# Patient Record
Sex: Female | Born: 1935 | Race: White | Hispanic: No
Health system: Southern US, Community
[De-identification: ages and names within clinical notes are randomized; demographics above are authoritative.]

---

## 2009-12-02 ENCOUNTER — Ambulatory Visit: Payer: Self-pay | Admitting: Internal Medicine

## 2010-08-13 ENCOUNTER — Emergency Department: Payer: Self-pay | Admitting: Internal Medicine

## 2010-08-22 ENCOUNTER — Ambulatory Visit: Payer: Self-pay | Admitting: Internal Medicine

## 2010-08-31 ENCOUNTER — Ambulatory Visit: Payer: Self-pay | Admitting: Internal Medicine

## 2011-01-11 DEATH — deceased

## 2011-05-24 ENCOUNTER — Observation Stay: Payer: Self-pay | Admitting: Internal Medicine

## 2011-08-22 IMAGING — CT CT ANGIOGRAPHY NECK
1 of 4 series · 12 of 33 positions shown · IV contrast (APPLIED)
Comparison: none

REASON FOR EXAM: TIA
COMMENTS:

PROCEDURE:     CT  - CT ANGIOGRAPHY NECK W/CONTRAST  - August 31, 2010 [DATE]
RESULT:     Comparison: MRI of the brain 08/22/2010
TECHNIQUE: 100 ml of Isovue 370 was administered and the extracranial
carotid arteries bilaterally were scanned during the arterial phase from the
level of the superior portion of the frontal sinuses to the proximal neck.
These images were then transferred to the Siemens work station and were
subsequently reviewed utilizing 3-D reconstructions and MIP images.

[Series 6: soft tissue · axial · 0.44mm/px · z∈[+708,+924]mm · 12 of 86 slices shown]
[im 7/86  soft-tissue]
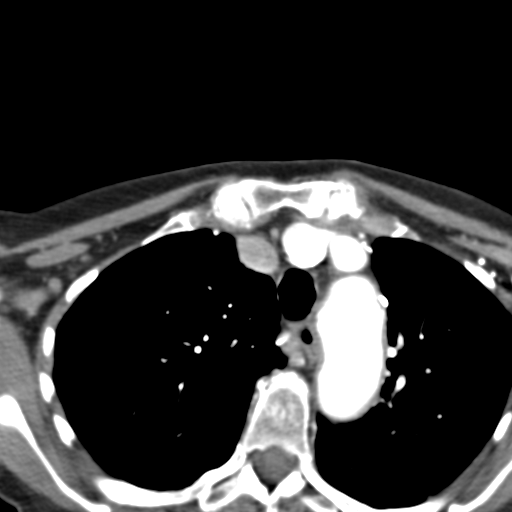
[im 14/86  bone]
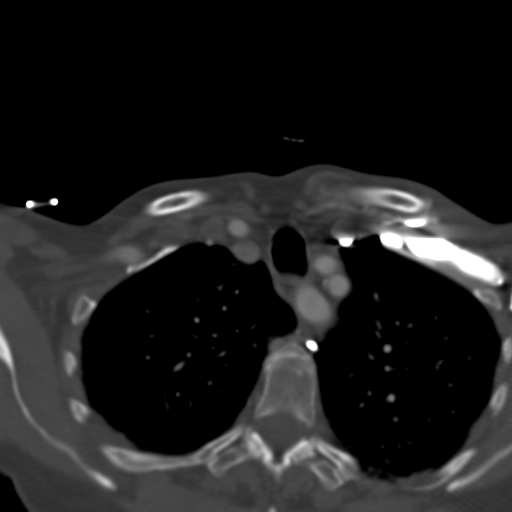
[im 20/86  soft-tissue]
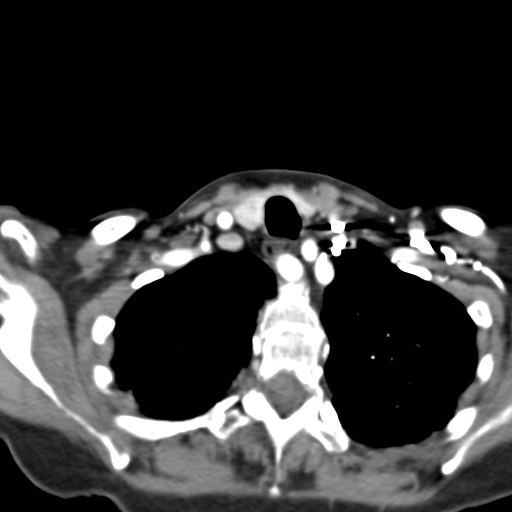
[im 27/86  bone]
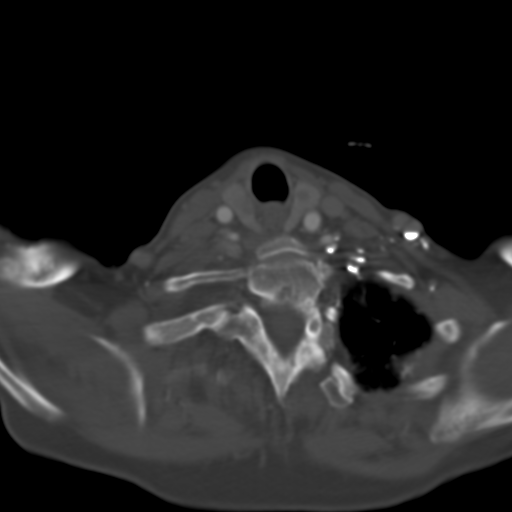
[im 33/86  soft-tissue]
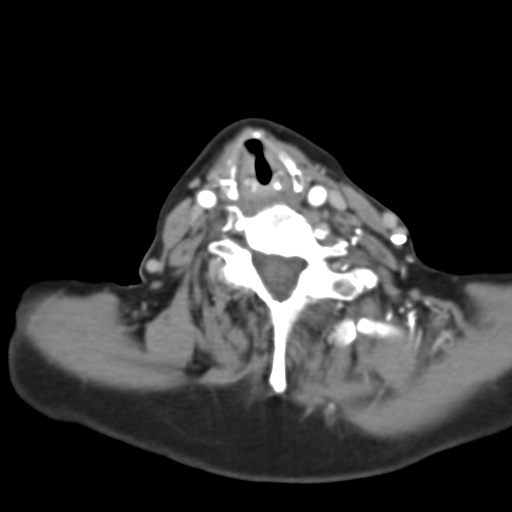
[im 40/86  bone]
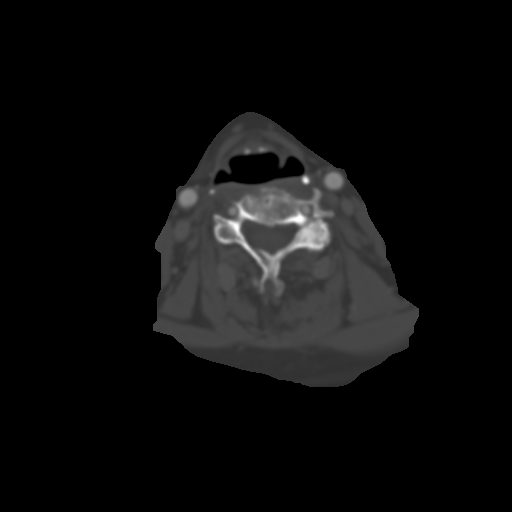
[im 46/86  soft-tissue]
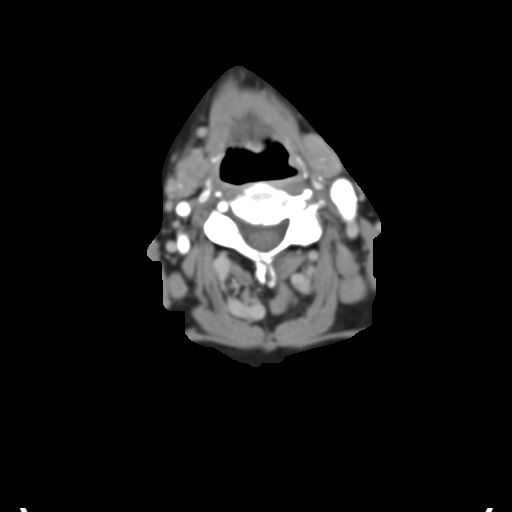
[im 53/86  bone]
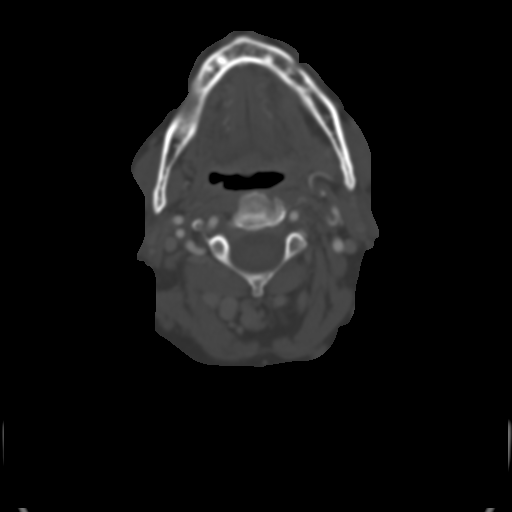
[im 59/86  soft-tissue]
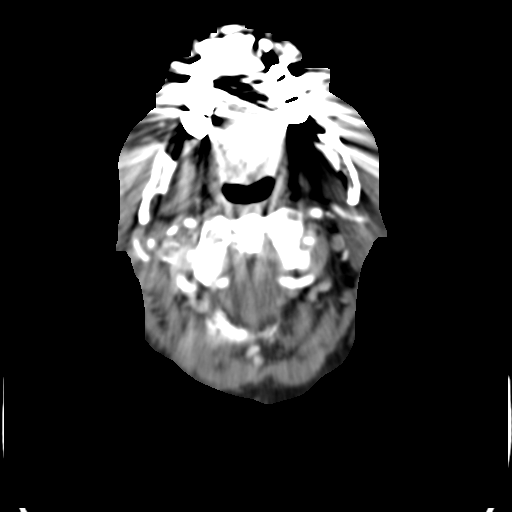
[im 66/86  bone]
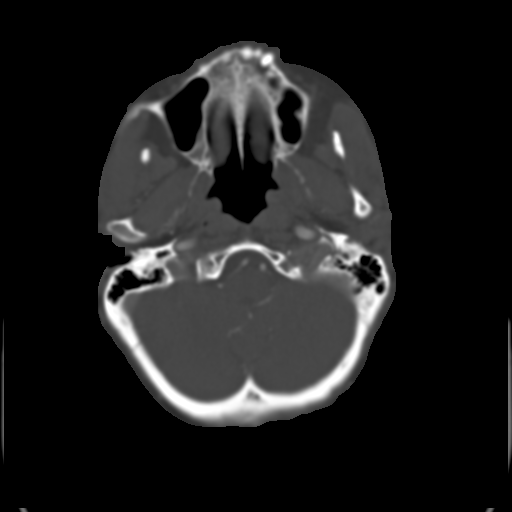
[im 72/86  soft-tissue]
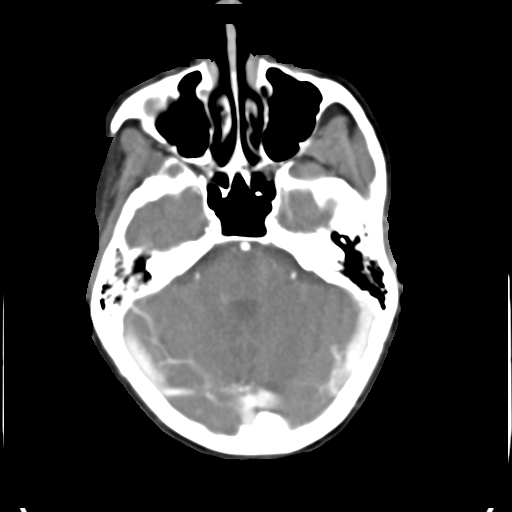
[im 79/86  bone]
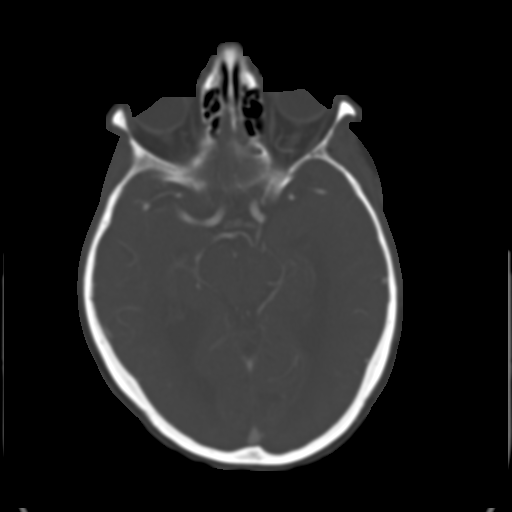

[12 of 33 positions shown; findings below may reference images not displayed]

FINDINGS: Mild atherosclerotic plaque is demonstrated in the right carotid bulb and
internal carotid artery, without hemodynamically significant stenosis. There
is minimal tortuosity of the right internal carotid artery. No
hemodynamically significant stenosis demonstrated in the left carotid
artery. The vertebral arteries are patent bilaterally.

There are multiple low-attenuation lesions in the bilateral lobes of the
thyroid, which are nonspecific. The largest measures 1.0 cm in diameter.
There are multiple collateral vessels in the posterior neck. No
lymphadenopathy identified. Mild vascular calcifications are demonstrated in
the thoracic aorta.

No aggressive lytic or sclerotic osseous lesions identified. There is mild
polypoid mucous thickening in the right maxillary sinus.
IMPRESSION: 1. Mild atherosclerotic plaques in the right carotid artery, without
hemodynamically significant stenosis.
2. Multiple small low-attenuation lesions in the thyroid which are
nonspecific. Further evaluation could be provided with thyroid ultrasound,
as indicated.

## 2015-02-11 ENCOUNTER — Ambulatory Visit
Admit: 2015-02-11 | Disposition: A | Discharge status: Home / Self Care | Payer: Self-pay | Attending: Hospice and Palliative Medicine | Admitting: Hospice and Palliative Medicine

## 2015-03-06 LAB — BASIC METABOLIC PANEL
Anion Gap: 10 (ref 7–16)
BUN: 31 mg/dL — ABNORMAL HIGH
CALCIUM: 9.3 mg/dL
CO2: 29 mmol/L
Chloride: 100 mmol/L — ABNORMAL LOW
Creatinine: 1.33 mg/dL — ABNORMAL HIGH
EGFR (African American): 40 — ABNORMAL LOW
GFR CALC NON AF AMER: 34 — AB
Glucose: 119 mg/dL — ABNORMAL HIGH
POTASSIUM: 3.9 mmol/L
SODIUM: 139 mmol/L

## 2015-03-06 LAB — CBC WITH DIFFERENTIAL/PLATELET
BASOS ABS: 0 10*3/uL (ref 0.0–0.1)
Basophil %: 0.6 %
Eosinophil #: 0.1 10*3/uL (ref 0.0–0.7)
Eosinophil %: 1.3 %
HCT: 47.5 % — AB (ref 35.0–47.0)
HGB: 16.2 g/dL — ABNORMAL HIGH (ref 12.0–16.0)
LYMPHS PCT: 21.3 %
Lymphocyte #: 1.7 10*3/uL (ref 1.0–3.6)
MCH: 35.5 pg — ABNORMAL HIGH (ref 26.0–34.0)
MCHC: 34.2 g/dL (ref 32.0–36.0)
MCV: 104 fL — ABNORMAL HIGH (ref 80–100)
Monocyte #: 1 x10 3/mm — ABNORMAL HIGH (ref 0.2–0.9)
Monocyte %: 12.8 %
NEUTROS PCT: 64 %
Neutrophil #: 5.1 10*3/uL (ref 1.4–6.5)
Platelet: 199 10*3/uL (ref 150–440)
RBC: 4.57 10*6/uL (ref 3.80–5.20)
RDW: 14.9 % — AB (ref 11.5–14.5)
WBC: 8 10*3/uL (ref 3.6–11.0)

## 2015-03-06 LAB — TROPONIN I: Troponin-I: 0.03 ng/mL

## 2015-03-06 LAB — PROTIME-INR
INR: 2
Prothrombin Time: 22.6 secs — ABNORMAL HIGH

## 2015-03-07 ENCOUNTER — Inpatient Hospital Stay
Admit: 2015-03-07 | Disposition: A | Discharge status: Skilled Nursing Facility | Payer: Self-pay | Attending: Internal Medicine | Admitting: Internal Medicine

## 2015-03-07 LAB — URINALYSIS, COMPLETE
BILIRUBIN, UR: NEGATIVE
Bacteria: NONE SEEN
Blood: NEGATIVE
Glucose,UR: NEGATIVE mg/dL (ref 0–75)
Nitrite: NEGATIVE
PH: 5 (ref 4.5–8.0)
Protein: 30
Specific Gravity: 1.023 (ref 1.003–1.030)

## 2015-03-08 LAB — TSH: Thyroid Stimulating Horm: 3.323 u[IU]/mL

## 2015-03-08 LAB — CBC WITH DIFFERENTIAL/PLATELET
Basophil #: 0 x10 3/mm 3
Basophil %: 0.5 %
Eosinophil #: 0.1 x10 3/mm 3
Eosinophil %: 0.8 %
HCT: 48.1 % — ABNORMAL HIGH
HGB: 15.9 g/dL
Lymphocyte %: 12 %
Lymphs Abs: 1.1 x10 3/mm 3
MCH: 34.4 pg — ABNORMAL HIGH
MCHC: 33.1 g/dL
MCV: 104 fL — ABNORMAL HIGH
Monocyte #: 1 "x10 3/mm " — ABNORMAL HIGH
Monocyte %: 11 %
Neutrophil #: 7.1 x10 3/mm 3 — ABNORMAL HIGH
Neutrophil %: 75.7 %
Platelet: 190 x10 3/mm 3
RBC: 4.63 X10 6/mm 3
RDW: 14.9 % — ABNORMAL HIGH
WBC: 9.3 x10 3/mm 3

## 2015-03-08 LAB — BASIC METABOLIC PANEL WITH GFR
Anion Gap: 9
BUN: 30 mg/dL — ABNORMAL HIGH
Calcium, Total: 8.6 mg/dL — ABNORMAL LOW
Chloride: 104 mmol/L
Co2: 24 mmol/L
Creatinine: 1.26 mg/dL — ABNORMAL HIGH
EGFR (African American): 42 — ABNORMAL LOW
EGFR (Non-African Amer.): 36 — ABNORMAL LOW
Glucose: 128 mg/dL — ABNORMAL HIGH
Potassium: 3.9 mmol/L
Sodium: 137 mmol/L

## 2015-03-08 LAB — PROTIME-INR
INR: 2.3
Prothrombin Time: 25.8 secs — ABNORMAL HIGH

## 2015-03-09 LAB — BASIC METABOLIC PANEL
Anion Gap: 12 (ref 7–16)
BUN: 35 mg/dL — ABNORMAL HIGH
CALCIUM: 8.5 mg/dL — AB
Chloride: 101 mmol/L
Co2: 24 mmol/L
Creatinine: 1.41 mg/dL — ABNORMAL HIGH
EGFR (African American): 37 — ABNORMAL LOW
GFR CALC NON AF AMER: 32 — AB
Glucose: 113 mg/dL — ABNORMAL HIGH
Potassium: 3.5 mmol/L
Sodium: 137 mmol/L

## 2015-03-09 LAB — PROTIME-INR
INR: 3.1
Prothrombin Time: 31.6 secs — ABNORMAL HIGH

## 2015-03-10 LAB — BASIC METABOLIC PANEL
Anion Gap: 10 (ref 7–16)
BUN: 34 mg/dL — ABNORMAL HIGH
CALCIUM: 8.4 mg/dL — AB
CHLORIDE: 100 mmol/L — AB
Co2: 28 mmol/L
Creatinine: 1.45 mg/dL — ABNORMAL HIGH
EGFR (African American): 36 — ABNORMAL LOW
GFR CALC NON AF AMER: 31 — AB
GLUCOSE: 107 mg/dL — AB
POTASSIUM: 3.3 mmol/L — AB
Sodium: 138 mmol/L

## 2015-03-10 LAB — CBC WITH DIFFERENTIAL/PLATELET
BASOS PCT: 0.4 %
Basophil #: 0 10*3/uL (ref 0.0–0.1)
EOS PCT: 2 %
Eosinophil #: 0.2 10*3/uL (ref 0.0–0.7)
HCT: 44.9 % (ref 35.0–47.0)
HGB: 15.5 g/dL (ref 12.0–16.0)
LYMPHS ABS: 1.2 10*3/uL (ref 1.0–3.6)
Lymphocyte %: 12.1 %
MCH: 35.6 pg — AB (ref 26.0–34.0)
MCHC: 34.5 g/dL (ref 32.0–36.0)
MCV: 103 fL — AB (ref 80–100)
MONO ABS: 1 x10 3/mm — AB (ref 0.2–0.9)
Monocyte %: 10.9 %
NEUTROS ABS: 7.1 10*3/uL — AB (ref 1.4–6.5)
NEUTROS PCT: 74.6 %
Platelet: 174 10*3/uL (ref 150–440)
RBC: 4.35 10*6/uL (ref 3.80–5.20)
RDW: 14.5 % (ref 11.5–14.5)
WBC: 9.5 10*3/uL (ref 3.6–11.0)

## 2015-03-10 LAB — PROTIME-INR
INR: 4
Prothrombin Time: 38.8 secs — ABNORMAL HIGH

## 2015-03-11 LAB — PROTIME-INR
INR: 3.5
Prothrombin Time: 35.1 secs — ABNORMAL HIGH

## 2015-03-11 LAB — BASIC METABOLIC PANEL
Anion Gap: 10 (ref 7–16)
BUN: 32 mg/dL — AB
CREATININE: 1.13 mg/dL — AB
Calcium, Total: 8.5 mg/dL — ABNORMAL LOW
Chloride: 99 mmol/L — ABNORMAL LOW
Co2: 31 mmol/L
EGFR (African American): 48 — ABNORMAL LOW
EGFR (Non-African Amer.): 42 — ABNORMAL LOW
Glucose: 103 mg/dL — ABNORMAL HIGH
Potassium: 3.4 mmol/L — ABNORMAL LOW
SODIUM: 140 mmol/L

## 2015-03-11 LAB — MAGNESIUM: MAGNESIUM: 1.8 mg/dL

## 2015-03-12 LAB — CULTURE, BLOOD (SINGLE)

## 2015-03-13 NOTE — H&P (Signed)
PATIENT NAME:  Kari Cline, Kari Cline MR#:  [id number] DATE OF BIRTH:  September 24, 1918  DATE OF ADMISSION:  03/07/2015.  REFERRING PHYSICIAN:  Dr. Loleta Rose.    PRIMARY CARE PHYSICIAN:  Dr. Einar Crow.   ADMISSION DIAGNOSES:  Pneumonia and atrial fibrillation with rapid ventricular rate.   HISTORY OF PRESENT ILLNESS:  This is a 81 year old Caucasian female who presented to the Emergency Department complaining of feeling weak.  She states that her weakness has been worsening over the last week and that she has also had some shortness of breath today which is relatively new.  She denies any chest pain prior to arrival but has felt brief twinges of pain substernally.  The pain is nonradiating and did not specifically precede her shortness of breath but rather has been sporadic over the last few days.  In the Emergency Department, the patient was found to have effusions as well as some atelectasis versus infiltrates in her bilateral lung bases which prompted the Emergency Department to call for admission.  She also has chronic AFib that is currently not rate controlled.   REVIEW OF SYSTEMS:  CONSTITUTIONAL:  The patient denies fevers but admits to generalized weakness.  EYES:  Denies blurry vision or inflammation.  EARS, NOSE AND THROAT:  Denies tinnitus or sore throat.  RESPIRATORY:  Admits to shortness of breath but denies cough.  CARDIOVASCULAR:  Denies chest pain at this time as well as palpitations, orthopnea, or paroxysmal nocturnal dyspnea.  GASTROINTESTINAL:  Denies nausea, vomiting, diarrhea, or abdominal pain.  GENITOURINARY:  Denies dysuria or increased frequency or hesitancy of urination.  ENDOCRINE:  Denies polyuria or polydipsia.  HEMATOLOGIC AND LYMPHATIC:  Denies easy bruising or bleeding.  INTEGUMENTARY:  Denies rashes or lesions.  MUSCULOSKELETAL:  Denies arthralgias or myalgias.  NEUROLOGIC:  Denies numbness in her extremities or dysarthria.  PSYCHIATRIC:  Denies depression or  suicidal ideation.   PAST MEDICAL HISTORY:  Hypertension, atrial fibrillation, history of embolic cerebrovascular accident, glaucoma, osteopenia, sciatica, and scoliosis.   PAST SURGICAL HISTORY:  Hysterectomy, tonsillectomy with adenoidectomy, cataract removal, and bilateral total knee repair.   SOCIAL HISTORY:  The patient lives in an assisted living facility, [facility].  She uses a walker to ambulate.  She does not smoke, drink, or do any drugs.   FAMILY HISTORY:  The patient is unaware of any chronic medical conditions that run throughout her family.   MEDICATIONS:  1. Amiodarone 200 mg a half a tablet p.o. every other day.  2. Calcium OTC 600 mg 2 times a day.  3. Fosamax 70 mg 1 tablet p.o. once per week.  4. Hydrochlorothiazide 12.5 mg 1 tablet p.o. every day.  5. Latanoprost ophthalmic solution 2.5 mL to each affected eye every night before bed.  6. Multivitamin 1 tablet p.o. daily.  7. Quinapril 40 mg 2 tablets p.o. daily.  8. Simvastatin 10 mg 1 tablet p.o. at bedtime.  9. Tums 1 tablet p.o. as needed.  10.  Vitamin D3 one tablet p.o. weekly.  11.  Warfarin 3 mg 1 tablet p.o. daily.   ALLERGIES:  No known drug allergies.   PERTINENT LABORATORY RESULTS AND RADIOGRAPHIC FINDINGS:  Serum glucose is 119, BUN 31, creatinine 1.33, serum sodium 139, potassium 3.9.  Chloride is 100.  Bicarbonate is 29.  Troponin is 0.03.  Calcium 9.3.  White blood cell count is 8.  Hemoglobin is 16.2, hematocrit 47.5, platelet count 199,000.  MCV is 104.  INR is 2.  Urinalysis shows 6-30 white blood  cells per high-powered field and trace leukocyte esterase.  It is nitrite negative.  The patient is asymptomatic for urinary tract infection.  Chest x-ray shows bilateral layering plural effusions and associated bibasilar atelectasis versus infiltrate.  There is mild cardiomegaly as well aortic atherosclerosis.  EKG shows AFib with a rapid ventricular rate.   PHYSICAL EXAMINATION:  VITAL SIGNS:  Temperature  is 97.2, pulse 130, respirations 20.  Blood pressure is 158/91. Pulse oximetry is 95% on room air.  GENERAL:  The patient is alert and oriented x3, in no apparent distress.  HEENT:  Normocephalic, atraumatic.  Pupils equal, round, and reactive to light and accommodation.  Extraocular movements are intact.  Mucous membranes are moist.  NECK:  Trachea is midline. No adenopathy.  Thyroid is nonpalpable, nontender.  CHEST:   Symmetric and atraumatic.  CARDIOVASCULAR:  Irregularly irregular rate.  Normal S1, S2.  No rubs, clicks, or murmurs appreciated.  LUNGS:  Clear to auscultation bilaterally, although there are some diminished breath sounds on the left base.  There is normal effort and excursion.  ABDOMEN:  Positive bowel sounds.  Soft, nontender, nondistended.  No hepatosplenomegaly.  GENITOURINARY:  Deferred.  MUSCULOSKELETAL:  The patient moves all 4 extremities equally.  There is 5/5 strength in upper and lower extremities bilaterally.  SKIN:  Warm and dry.  There are no rashes or lesions.  EXTREMITIES:  No clubbing or cyanosis.  The patient does have 1+ pitting edema of her lower extremities which is not new.  NEUROLOGIC:  Cranial nerves II through XII are grossly intact.  PSYCHIATRIC:  Mood is normal; affect is congruent.  The patient has excellent judgment and insight into her medical condition.   ASSESSMENT AND PLAN:  This is a 81 year old female admitted for pneumonia and atrial fibrillation with rapid ventricular response.   1. Pneumonia.  Will treat the patient for healthcare-associated pneumonia.  Levaquin was started in the Emergency Department.  I have added vancomycin due to the layering effusion although if those parapneumonic effusions do not appear to form empyemas and have risk for necrotizing streptococcal pneumonia, then the vancomycin may be dropped as it is nephrotoxic.  2. Atrial fibrillation with rapid ventricular response.  This is likely secondary to the inflammation from  the patient's pneumonia.  She is not in any respiratory distress at this time nor does she have any chest pain.  I have loaded the patient with IV amiodarone, and we will increase her maintenance oral dose from every other day to daily dosing.  Will continue warfarin.   Recommend consulting cardiology in the morning if heart rate is not improved.  3. Glaucoma.  Continue latanoprost drops.  4. Osteopenia.  Continue Fosamax and vitamin D.  5. Lower extremity edema.  The patient has no documentation of congestive heart failure, although she does have some cardiomegaly on chest x-ray.  She has no pulmonary edema at this time; thus, her shortness of breath is presumably from her bilateral pulmonary effusions secondary to pneumonia.  Will keep an eye on her fluid balance and respiratory status.  6. Deep venous thrombosis prophylaxis.  Continue warfarin.  7. Gastrointestinal prophylaxis.  None as the patient is not critically ill.   CODE STATUS:  The patient is a full code.   TIME SPENT ON ADMISSION ORDERS AND PATIENT CARE:  Approximately 35 minutes.     ____________________________ Kelton Pillar. Sheryle Hail, MD msd:kc D: 03/07/2015 20:47:39 ET T: 03/07/2015 22:02:17 ET JOB#: [id number]  cc: Kelton Pillar. Sheryle Hail, MD, <Dictator> Stephen Turnbaugh  S Nyala Kirchner MD ELECTRONICALLY SIGNED 03/08/2015 0:23

## 2015-03-13 NOTE — Consult Note (Signed)
Chief Complaint:  Subjective/Chief Complaint Patient feels okay. known recent chest pain still feels short of breath swelling has improved   VITAL SIGNS/ANCILLARY NOTES: **Vital Signs.:   26-Apr-16 08:50  Vital Signs Type Routine  Temperature Temperature (F) 98.1  Celsius 36.7  Temperature Source oral  Pulse Pulse 121  Pulse source if not from Vital Sign Device per Telemetry Clerk  Respirations Respirations 20  Systolic BP Systolic BP 155  Diastolic BP (mmHg) Diastolic BP (mmHg) 90  Mean BP 111  Pulse Ox % Pulse Ox % 95  Pulse Ox Activity Level  At rest  Oxygen Delivery 2L  *Intake and Output.:   Daily 26-Apr-16 07:00  Grand Totals Intake:   Output:  1325    Net:  -1325 24 Hr.:  -1325  Urine ml     Out:  1325  Length of Stay Totals Intake:  250 Output:  1425    Net:  -1175   Brief Assessment:  GEN well developed, well nourished, no acute distress   Cardiac Irregular  murmur present  + LE edema  + JVD  --Gallop   Respiratory normal resp effort  rhonchi   Gastrointestinal Normal   Gastrointestinal details normal Soft   EXTR negative cyanosis/clubbing, negative edema   Lab Results: Thyroid:  26-Apr-16 03:56   Thyroid Stimulating Hormone 3.323 (0.350-4.500 NOTE: New Reference Range  01/18/15)  Routine Chem:  26-Apr-16 03:56   Glucose, Serum  128 (65-99 NOTE: New Reference Range  01/18/15)  BUN  30 (6-20 NOTE: New Reference Range  01/18/15)  Creatinine (comp)  1.26 (0.44-1.00 NOTE: New Reference Range  01/18/15)  Sodium, Serum 137 (135-145 NOTE: New Reference Range  01/18/15)  Potassium, Serum 3.9 (3.5-5.1 NOTE: New Reference Range  01/18/15)  Chloride, Serum 104 (101-111 NOTE: New Reference Range  01/18/15)  CO2, Serum 24 (22-32 NOTE: New Reference Range  01/18/15)  Calcium (Total), Serum  8.6 (8.9-10.3 NOTE: New Reference Range  01/18/15)  Anion Gap 9  eGFR (African American)  42  eGFR (Non-African American)  36 (eGFR values <29mL/min/1.73  m2 may be an indication of chronic kidney disease (CKD). Calculated eGFR is useful in patients with stable renal function. The eGFR calculation will not be reliable in acutely ill patients when serum creatinine is changing rapidly. It is not useful in patients on dialysis. The eGFR calculation may not be applicable to patients at the low and high extremes of body sizes, pregnant women, and vegetarians.)  Routine Coag:  26-Apr-16 03:56   Prothrombin  25.8 (11.4-15.0 NOTE: New Reference Range  12/10/14)  INR 2.3 (INR reference interval applies to patients on anticoagulant therapy. A single INR therapeutic range for coumarins is not optimal for all indications; however, the suggested range for most indications is 2.0 - 3.0. Exceptions to the INR Reference Range may include: Prosthetic heart valves, acute myocardial infarction, prevention of myocardial infarction, and combinations of aspirin and anticoagulant. The need for a higher or lower target INR must be assessed individually. Reference: The Pharmacology and Management of the Vitamin K  antagonists: the seventh ACCP Conference on Antithrombotic and Thrombolytic Therapy. Chest.2004 Sept:126 (3suppl): L7870634. A HCT value >55% may artifactually increase the PT.  In one study,  the increase was an average of 25%. Reference:  "Effect on Routine and Special Coagulation Testing Values of Citrate Anticoagulant Adjustment in Patients with High HCT Values." American Journal of Clinical Pathology 2006;126:400-405.)  Routine Hem:  26-Apr-16 03:56   WBC (CBC) 9.3  RBC (CBC) 4.63  Hemoglobin (CBC) 15.9  Hematocrit (CBC)  48.1  Platelet Count (CBC) 190  MCV  104  MCH  34.4  MCHC 33.1  RDW  14.9  Neutrophil % 75.7  Lymphocyte % 12.0  Monocyte % 11.0  Eosinophil % 0.8  Basophil % 0.5  Neutrophil #  7.1  Lymphocyte # 1.1  Monocyte #  1.0  Eosinophil # 0.1  Basophil # 0.0 (Result(s) reported on 08 Mar 2015 at 04:21AM.)   Radiology  Results: XRay:    24-Apr-16 22:39, Chest Portable Single View  Chest Portable Single View   REASON FOR EXAM:    weakness  COMMENTS:       PROCEDURE: DXR - DXR PORTABLE CHEST SINGLE VIEW  - Mar 06 2015 10:39PM     CLINICAL DATA:  81 year old female with 2 week history of weakness    EXAM:  PORTABLE CHEST - 1 VIEW    COMPARISON:  Prior chest x-ray 08/13/2010    FINDINGS:  Mild cardiomegaly. Atherosclerotic calcification present in the  transverse aorta. Bilateral layering pleural effusions with  associated bibasilar opacities. No overt pulmonary edema. No  pneumothorax. No suspicious pulmonary nodule. No acute osseous  abnormality.     IMPRESSION:  1. Bilateral layering pleural effusions and associated bibasilar  atelectasis versus infiltrate.  2. Mild cardiomegaly.  3. Aortic atherosclerosis.      Electronically Signed    By: Jacqulynn Cadet M.D.    On: 03/06/2015 23:48     Verified By: Criselda Peaches, M.D.,  Cardiology:    24-Apr-16 21:59, ED ECG  Ventricular Rate 140  Atrial Rate 144  QRS Duration 80  QT 318  QTc 485  R Axis 107  T Axis 33  ECG interpretation   Atrial fibrillation with rapid ventricular response with premature ventricular or aberrantly conducted complexes  Anterolateral infarct (cited on or before 24-May-2011)  *** ** ** ** * ACUTE MI  ** ** ** **  Abnormal ECG  When compared with ECG of 24-May-2011 15:41,  Atrial fibrillation has replaced Sinus rhythm  Vent. rate has increased BY  68 BPM  Questionable change in initial forces of Lateral leads  ST elevation now present in Anterior leads  Nonspecific T wave abnormality now evident in Inferior leads  ----------unconfirmed----------  Confirmed by OVERREAD, NOT ([id number]), editor PEARSON, BARBARA ([id number]) on 03/08/2015 1:12:57 PM  ED ECG     25-Apr-16 12:59, Echo Doppler  Echo Doppler   REASON FOR EXAM:      COMMENTS:       PROCEDURE: [place] - ECHO DOPPLER COMPLETE(TRANSTHOR)  - Mar 07 2015  12:59PM     RESULT: Echocardiogram Report    Patient Name:   Kari Cline Date of Exam: 03/07/2015  Medical Rec #:  [id number]        Custom1:  Date ofBirth:  Aug 04, 1918     Height:       62.0 in  Patient Age:    81 years      Weight:       146.0 lb  Patient Gender: F             BSA:          1.67 m??    Indications: CHF  Sonographer:    Sherrie Sport RDCS  Referring Phys: Rosilyn Mings, S    Summary:   1. Left ventricular ejection fraction, by visual estimation, is 25 to   30%.   2. Severely decreased global left ventricular systolic function.  3. Decreased left ventricular internal cavity size.   4. Mildly dilated left atrium.   5. Mildly dilated right atrium.   6. Large pleural effusion.   7. Severe tricuspid regurgitation.   8. Moderate mitral valve regurgitation.   9. Myxomatous mitral valve.  10. Thickening of the anterior and posterior mitral valve leaflets.  11. Mild aortic valve sclerosis without stenosis.  12. Mild to moderate aortic regurgitation.  13. Mildly elevated pulmonary artery systolic pressure.  14. Moderately increased left ventricular posterior wall thickness.  2D AND M-MODE MEASUREMENTS (normal ranges within parentheses):  Left Ventricle:          Normal  IVSd (2D):      1.16 cm (0.7-1.1)  LVPWd (2D):     1.46 cm (0.7-1.1) Aorta/LA:                  Normal  LVIDd (2D):     3.57 cm (3.4-5.7) Aortic Root (2D): 2.90 cm (2.4-3.7)  LVIDs (2D):     3.14 cmLeft Atrium (2D): 3.10 cm (1.9-4.0)  LV FS (2D):     12.0 %   (>25%)  LV EF (2D):     26.7 %   (>50%)                                    Right Ventricle:                                    RVd (2D):        7.00 cm  LV DIASTOLIC FUNCTION:  MV Peak E: 0.99 m/s E/e' Ratio: 12.10                      Decel Time: 179 msec  SPECTRAL DOPPLER ANALYSIS (where applicable):  Mitral Valve:  MV P1/2 Time: 51.91 msec  MV Area, PHT: 4.24 cm??  Aortic Valve: AoV Max Vel: 0.75 m/s AoV Peak PG: 2.3 mmHg AoV Mean PG:  LVOT  Vmax: 0.49 m/s LVOT VTI:  LVOT Diameter: 2.00 cm  AoV Area, Vmax: 2.06 cm?? AoV Area, VTI:  AoV Area, Vmn:  Aortic Insufficiency:  AI Half-time:  476 msec  AI Decel Rate: 2.50 m/s??  Tricuspid Valve and PA/RV Systolic Pressure: TR Max Velocity: 2.96 m/s RA   Pressure: 10 mmHg RVSP/PASP: 45.2 mmHg  Pulmonic Valve:  PV Max Velocity: 0.57 m/s PV Max PG: 1.3 mmHg PV Mean PG:  PHYSICIAN INTERPRETATION:  Left Ventricle: The left ventricular internal cavity size was decreased.   LV septal wall thickness was normal. LV posterior wall thickness was   moderately increased. Global LV systolic function was severely decreased.   Left ventricular ejection fraction, by visual estimation, is 25 to 30%.  Right Ventricle: The right ventricular size is mildly enlarged. Global RV   systolic function is mildly reduced.  Left Atrium: The left atrium is mildly dilated.  Right Atrium: The right atrium is mildly dilated.  Pericardium: There is no evidence of pericardial effusion. There is a   large pleural effusion.  Mitral Valve: The mitral valve is myxomatous. There is thickening of the   anterior and posterior mitral valve leaflets. Moderate mitral valve   regurgitation is seen.  Tricuspid Valve: The tricuspid valve is normal. Severe tricuspid     regurgitation is visualized. The tricuspid regurgitant velocity is  2.96   m/s, and with an assumed right atrial pressure of 10 mmHg, the estimated   right ventricular systolic pressure is mildly elevated at 45.2 mmHg.  Aortic Valve: The aortic valve is normal. Mild aortic valve sclerosis is   present, with no evidence of aortic valve stenosis. Mild to moderate   aortic valve regurgitation is seen.  Pulmonic Valve: The pulmonic valve is normal. Trace pulmonic valve   regurgitation.    [place] MD  Electronically signed by Onaka Lujean Amel MD  Signature Date/Time: 03/07/2015/5:31:25 PM    *** Final ***  IMPRESSION: .        Verified By:  Yolonda Kida, M.D., MD   Assessment/Plan:  Invasive Device Daily Assessment of Necessity:  Does the patient currently have any of the following indwelling devices? none   Assessment/Plan:  Assessment IMP  congestive heart failure systolic dysfunction acute on chronic  cardiomyopathy systolic  atrial fibrillation chronic  edema  shortness of breath  weakness  coagulopathy secondary  Coumadin  hyperlipidemia  possible pneumonia  glaucoma  pleural effusion .   Plan PLAN  agree with telemetry  continue amiodarone for rhythm  anticoagulation for AFib and DVT prophylaxis  prior to discharge will consider discontinue anticoagulation for AFib because of the risk of falls and bleeding  consider discontinuing statin therapy as well  recommend physical therapy to help with overall weakness  congestive heart failure with systolic dysfunction recommend diuretic therapy ACE-inhibitor therapy  oxygen therapy to help with shortness of breath  antibiotic therapy as necessary to help with possible  pneumonia  cardiomyopathy systolic dysfunction unclear etiology possibly ischemic continue heart failure therapy  medical therapy do not recommend invasive studies   Electronic Signatures: Lujean Amel D (MD)  (Signed 26-Apr-16 17:58)  Authored: Chief Complaint, VITAL SIGNS/ANCILLARY NOTES, Brief Assessment, Lab Results, Radiology Results, Assessment/Plan   Last Updated: 26-Apr-16 17:58 by Yolonda Kida (MD)

## 2015-03-14 NOTE — Discharge Summary (Addendum)
PATIENT NAME:  Kari Cline, GIRONDA MR#:  [id number] DATE OF BIRTH:  [birth year]  DATE OF ADMISSION:  03/07/2015 DATE OF DISCHARGE:  03/11/2015  DISCHARGE DIAGNOSES:  1.  Acute systolic congestive heart failure with bilateral moderate pulmonary effusions.  2.  Healthcare-associated pneumonia.  3.  Chronic atrial fibrillation.   CODE STATUS:  LIMITED CODE.   HEALTHCARE POWER OF ATTORNEY:  Her daughter.   DISCHARGE MEDICATIONS:  Fosamax 70 mg 1 tablet p.o. once a week, morphine 20 mg per mL 0.13 mL every 4 hours as needed for shortness of breath and pain, amiodarone 100 mg 1 tablet p.o. once daily, digoxin 125 mcg p.o. once daily, metoprolol tartrate 75 mg (50 mg and 25 mg tablets) take twice a day, furosemide 20 mg 1 tablet p.o. 2 times a day, potassium chloride 10 mEq 1 tablet p.o. once daily, levofloxacin 750 mg 1 tablet every 48 hours x 3 doses and discontinue, aspirin 325 mg 1 tablet p.o. once daily, Colace 100 mg p.o. 2 times a day as needed for constipation. Discontinued Coumadin.   DISPOSITION:  Discharged home with hospice.  Continue oxygen 2 liters via nasal cannula. Continue Foley catheter to prevent skin breakdown.   DIET:  Regular as tolerated, mechanical soft.   FOLLOWUP:  With hospice care doctor in 1 to 2 weeks.  BRIEF HISTORY AND PHYSICAL AND HOSPITAL COURSE:  The patient is a 81 year old pleasant Caucasian female brought into the ED with a chief complaint of generalized weakness. Please review history and physical for details. The patient was admitted to the hospital with pneumonia and atrial fibrillation with RVR.  1.  Pneumonia, probably healthcare-associated pneumonia:  The patient was started on levofloxacin IV. Vancomycin was added to the regimen. Subsequently, vancomycin was discontinued and levofloxacin was continued. Chest x-ray had revealed pleural effusions bilaterally on chest x-ray. They were thought to be parapneumonic effusions. Levofloxacin was continued. Lasix was  continued. Clinically, the patient's situation improved. The patient's weakness significantly improved.  2.  Atrial fibrillation with RVR:  The patient was continued on digoxin, amiodarone, and she was evaluated by [facility], Dr. Juliann Pares. The patient has severe tricuspid regurgitation. Echocardiogram has revealed severe systolic dysfunction with 25% ejection fraction. We have continued her on Lasix p.o. b.i.d. in the IV form as her renal function was getting worse. After significant diuresis, it was changed to p.o. form. The patient tolerated Lasix well.  The plan is to continue metoprolol and held lisinopril in view of renal insufficiency. After changing the IV to p.o. Lasix, renal function started getting better. We still continued to still ACE inhibitor. Repeat chest x-ray has revealed still moderate bilateral pleural effusions, but the patient was clinically feeling better. Shortness of breath was significantly improved. This was discussed with the patient's daughter. She denied pursuing any procedures or thoracocentesis.  3.  Chronic atrial fibrillation with RVR:  Heart rate was better with low-dose amiodarone. Plan is to continue metoprolol and digoxin. As we are discontinuing lisinopril, metoprolol dose was increased to 75 mg. Cardiology has recommended to discontinue Coumadin in view of high risk for bleeding. After having several discussions with the daughter, the daughter was okay to discontinue Coumadin and we started her on aspirin.  4.  For hypertension, metoprolol dose was increased to 75 mg. Lasix was continued.  5.  Hyperlipidemia:  Statin was continued.  6.  The patient was still feeling weak. Palliative care was consulted. Her daughter has changed CODE STATUS to DO NOT HESITATE RESUSCITATE and requested home  with hospice care. Care management was consulted and home with hospice arrangements were made as per daughter's request. The patient's statin, Coumadin, and eye drops were  discontinued. The patient's weakness is improved and she started tolerating a diet.   DISCHARGE PHYSICAL EXAMINATION:  VITAL SIGNS:  Today, her temperature is 97.5, pulse 102, respirations 16, blood pressure 159/92, pulse oximetry 92% on 2 liters.  GENERAL APPEARANCE:  Not in acute distress, thin and frail lady in no apparent distress.  HEENT:  Normocephalic, atraumatic. Pupils are equal, reacting to light and accommodation. No scleral icterus. No conjunctival injection. No sinus tenderness. No postnasal drip. Moist mucous membranes.  NECK:  Supple. No JVD. No thyromegaly.  LUNGS:  Clear to auscultation, but at bases positive rhonchi, but moderate air entry.  CARDIOVASCULAR:  Irregularly irregular. Positive murmur.  GASTROINTESTINAL:  Soft. Bowel sounds are positive in all 4 quadrants. Nontender, nondistended. No masses felt.  NEUROLOGIC:  Awake and alert, oriented x 3.  EXTREMITIES:  Pitting edema 1+ is present. No cyanosis. No clubbing.   LABORATORIES AND IMAGING STUDIES:  Glucose 103, BUN 32, creatinine 1.13, sodium 140, potassium 3.4, chloride 99, CO2 of 31, anion gap 10, GFR 48, calcium 8.5, magnesium 1.8. TSH 3.323. WBC, hemoglobin, and hematocrit are normal, platelet count at 174,000. Today's PT 35.5, INR 3.5, but Coumadin was discontinued. Blood cultures have revealed no growth. Urinalysis:  Nitrites, negative leukocyte esterase. Echocardiogram with left ventricular ejection fraction of 25% to 30%, severely decreased with global left ventricular systolic function, decreased left ventricular internal cavity size, severe tricuspid regurgitation, large pleural effusion. Repeat chest x-ray PA and lateral views:  Moderate bilateral effusions and bibasilar atelectasis unchanged.   The patient is getting discharged to home with hospice care as per daughter's request.  MOLST form was filled out as the patient was a limited code. Plan of care discussed with the daughter and patient. All their questions  were answered.   TOTAL TIME SPENT ON DISCHARGE AND COORDINATION OF CARE:  40 minutes.    ____________________________ Ramonita LabAruna Eneida Evers, MD ag:TT D: 03/11/2015 18:05:00 ET T: 03/12/2015 01:15:59 ET JOB#: [id number]  cc: Ramonita LabAruna Burak Zerbe, MD, <Dictator> Marya AmslerMarshall W. Dareen PianoAnderson, MD Dwayne D. Juliann Paresallwood, MD Ramonita LabARUNA Sulma Ruffino MD ELECTRONICALLY SIGNED 03/23/2015 14:25

## 2016-01-11 DEATH — deceased

## 2016-02-28 IMAGING — CR DG CHEST 2V
1 series · 2 of 2 positions shown · non-contrast
Comparison: 03/06/2015

CLINICAL DATA: Weakness, follow-up CHF

EXAM:
CHEST  2 VIEW

[Series 1: dxr chest pa (or ap) and lateral · 0.14mm/px · 2 of 2 slices shown]
[im 1/2]
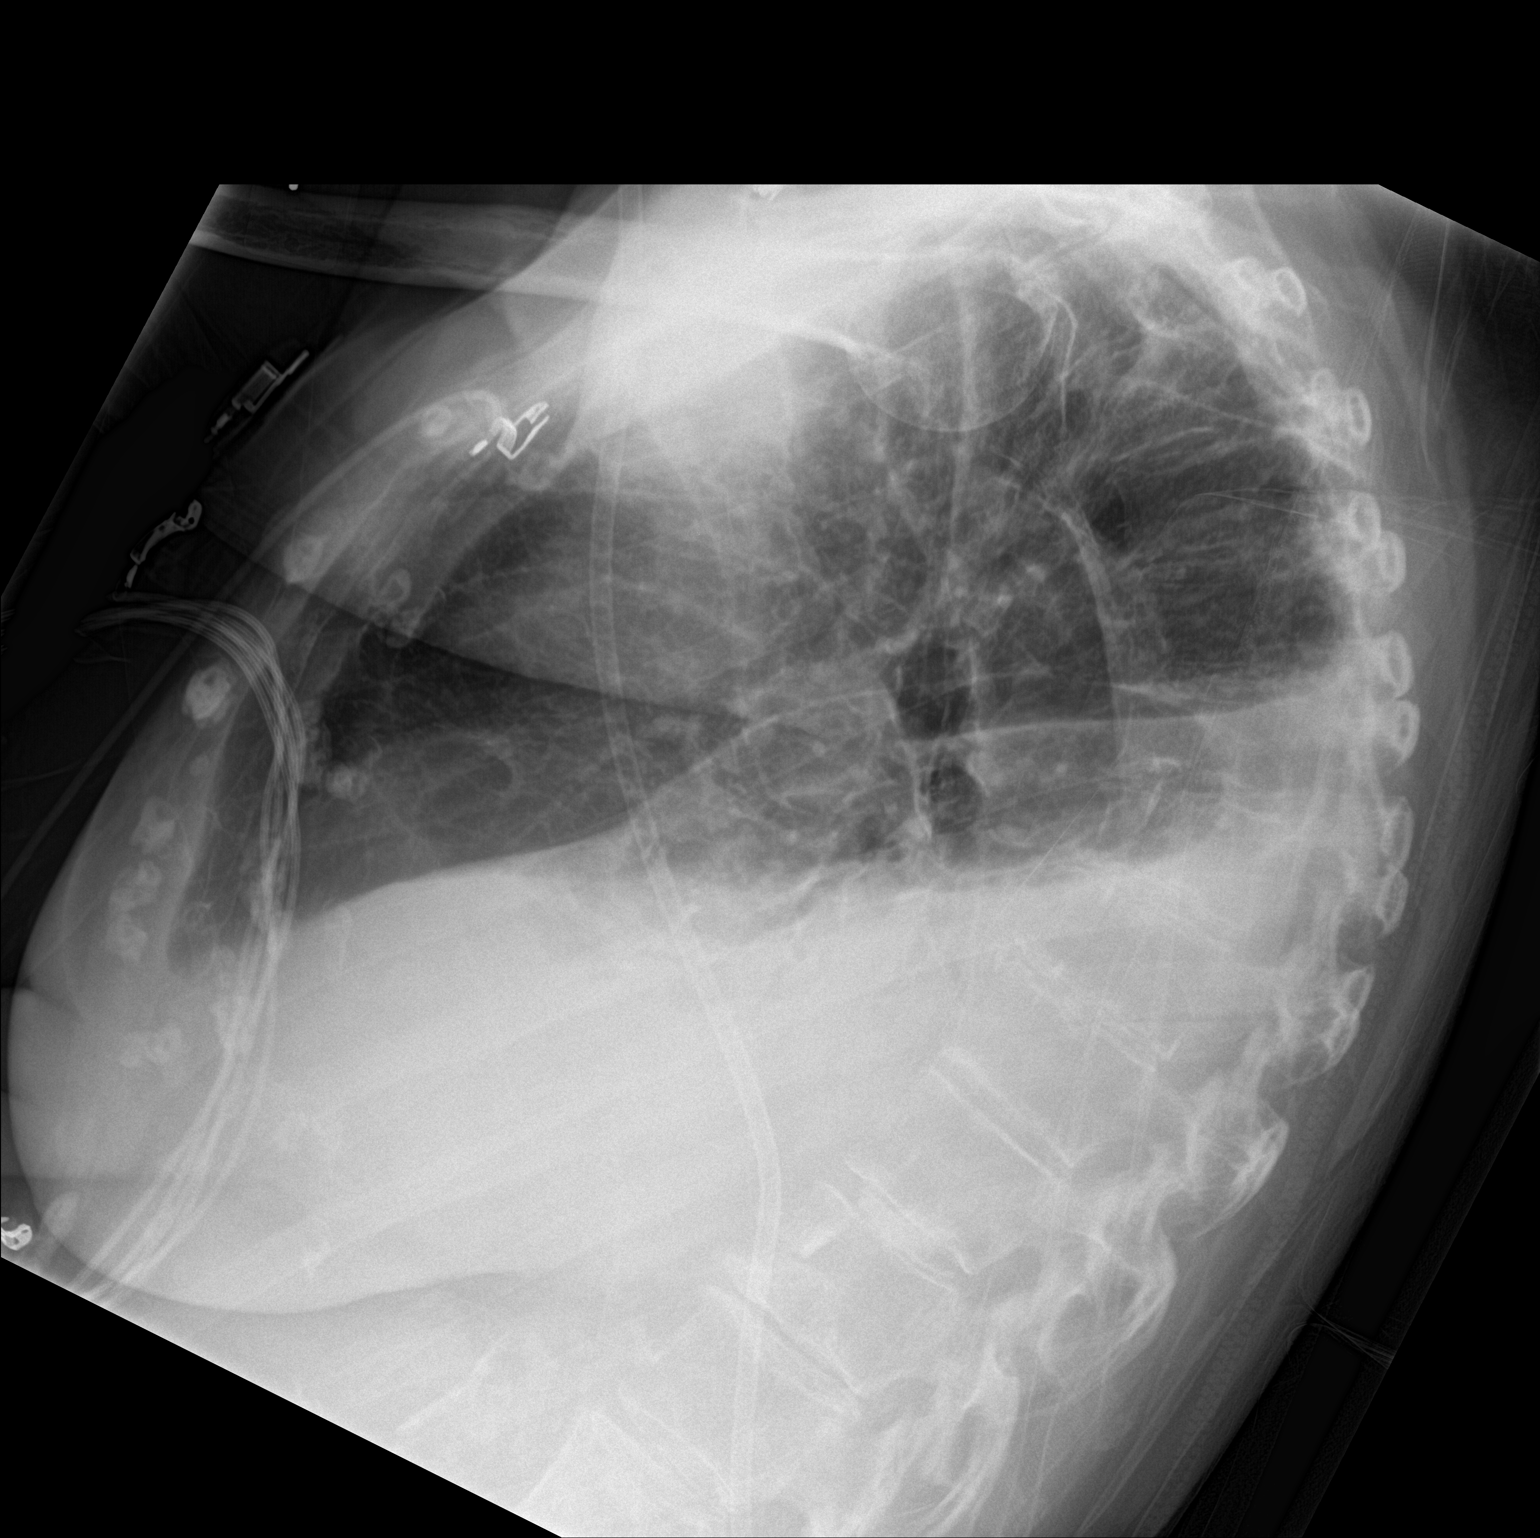
[im 2/2]
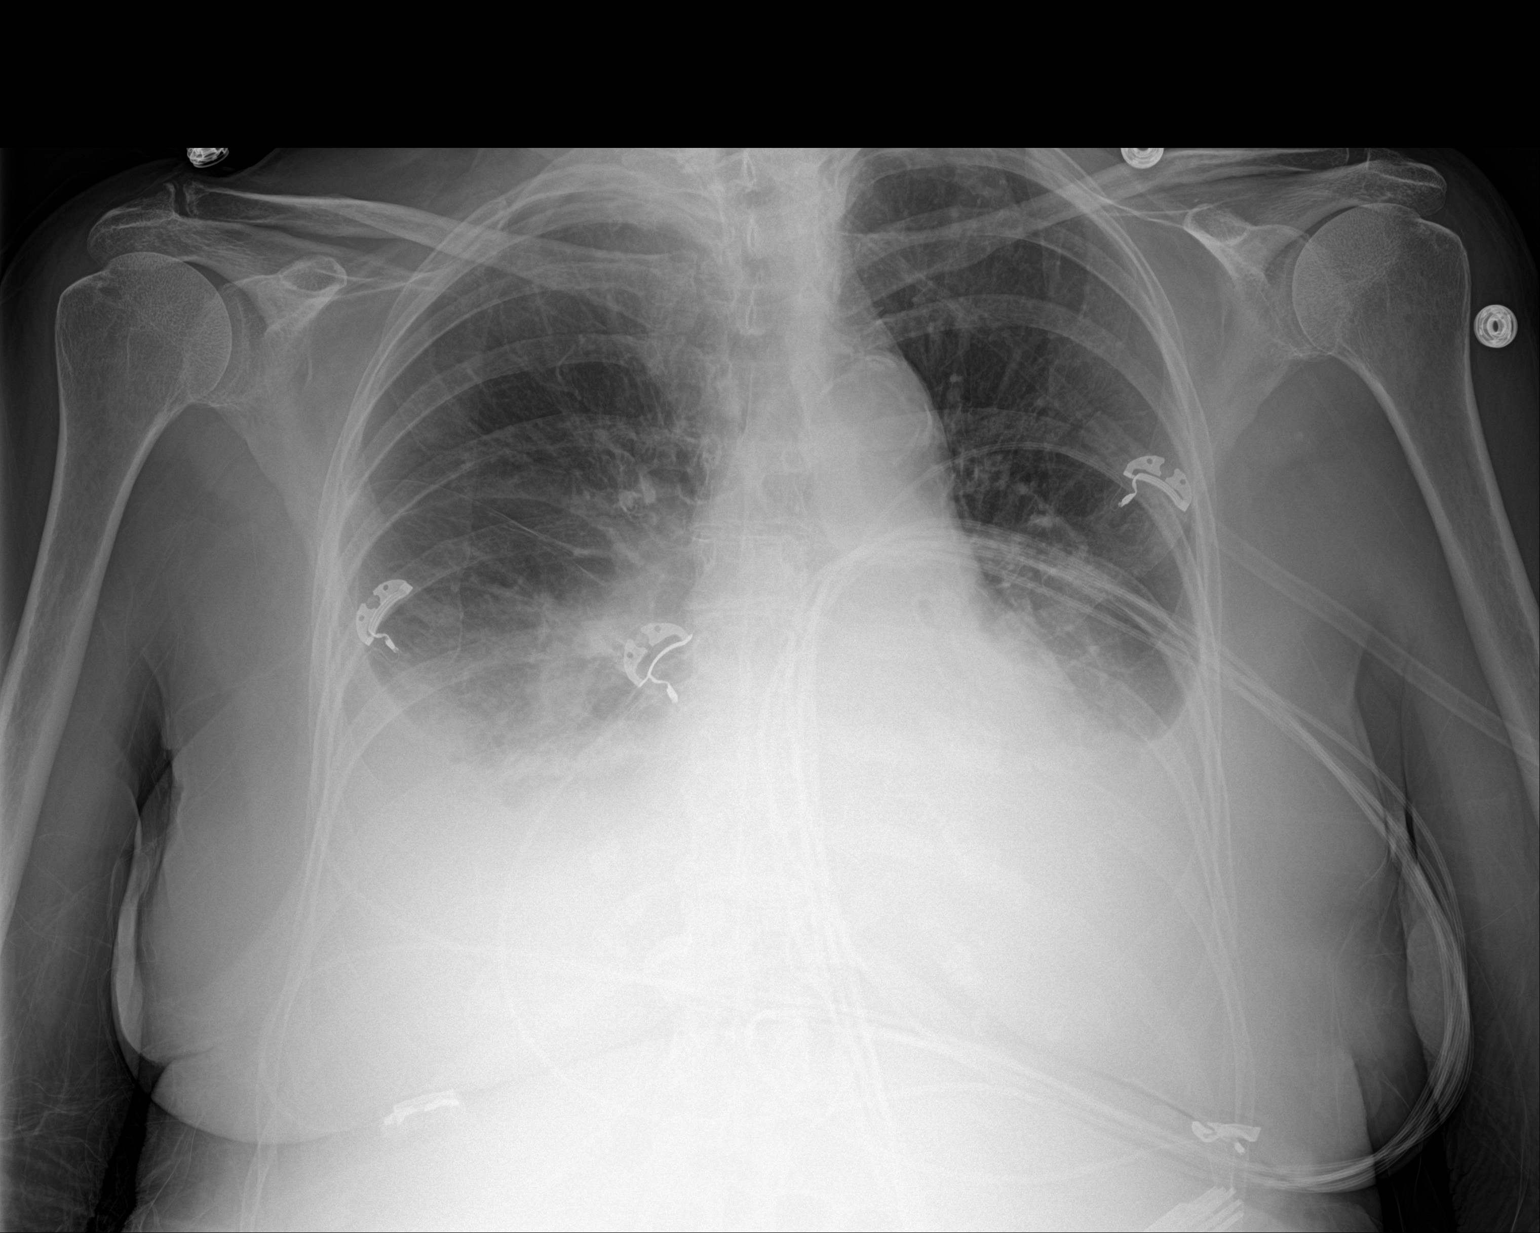

[2 of 2 positions shown; findings below may reference images not displayed]

FINDINGS: Mild right perihilar edema. Moderate bilateral pleural effusions,
increased. Associated bilateral lower lobe opacities, likely
atelectasis.

Cardiomegaly.

No pneumothorax.
IMPRESSION: Cardiomegaly with mild right perihilar edema and moderate bilateral
pleural effusions, increased.

## 2016-03-01 IMAGING — CR DG CHEST 2V
1 series · 2 of 2 positions shown · non-contrast
Comparison: 03/09/2015

CLINICAL DATA: Moderate bilateral pleural effusions

EXAM:
CHEST  2 VIEW

[Series 1: dxr chest pa (or ap) and lateral · 0.14mm/px · 2 of 2 slices shown]
[im 1/2]
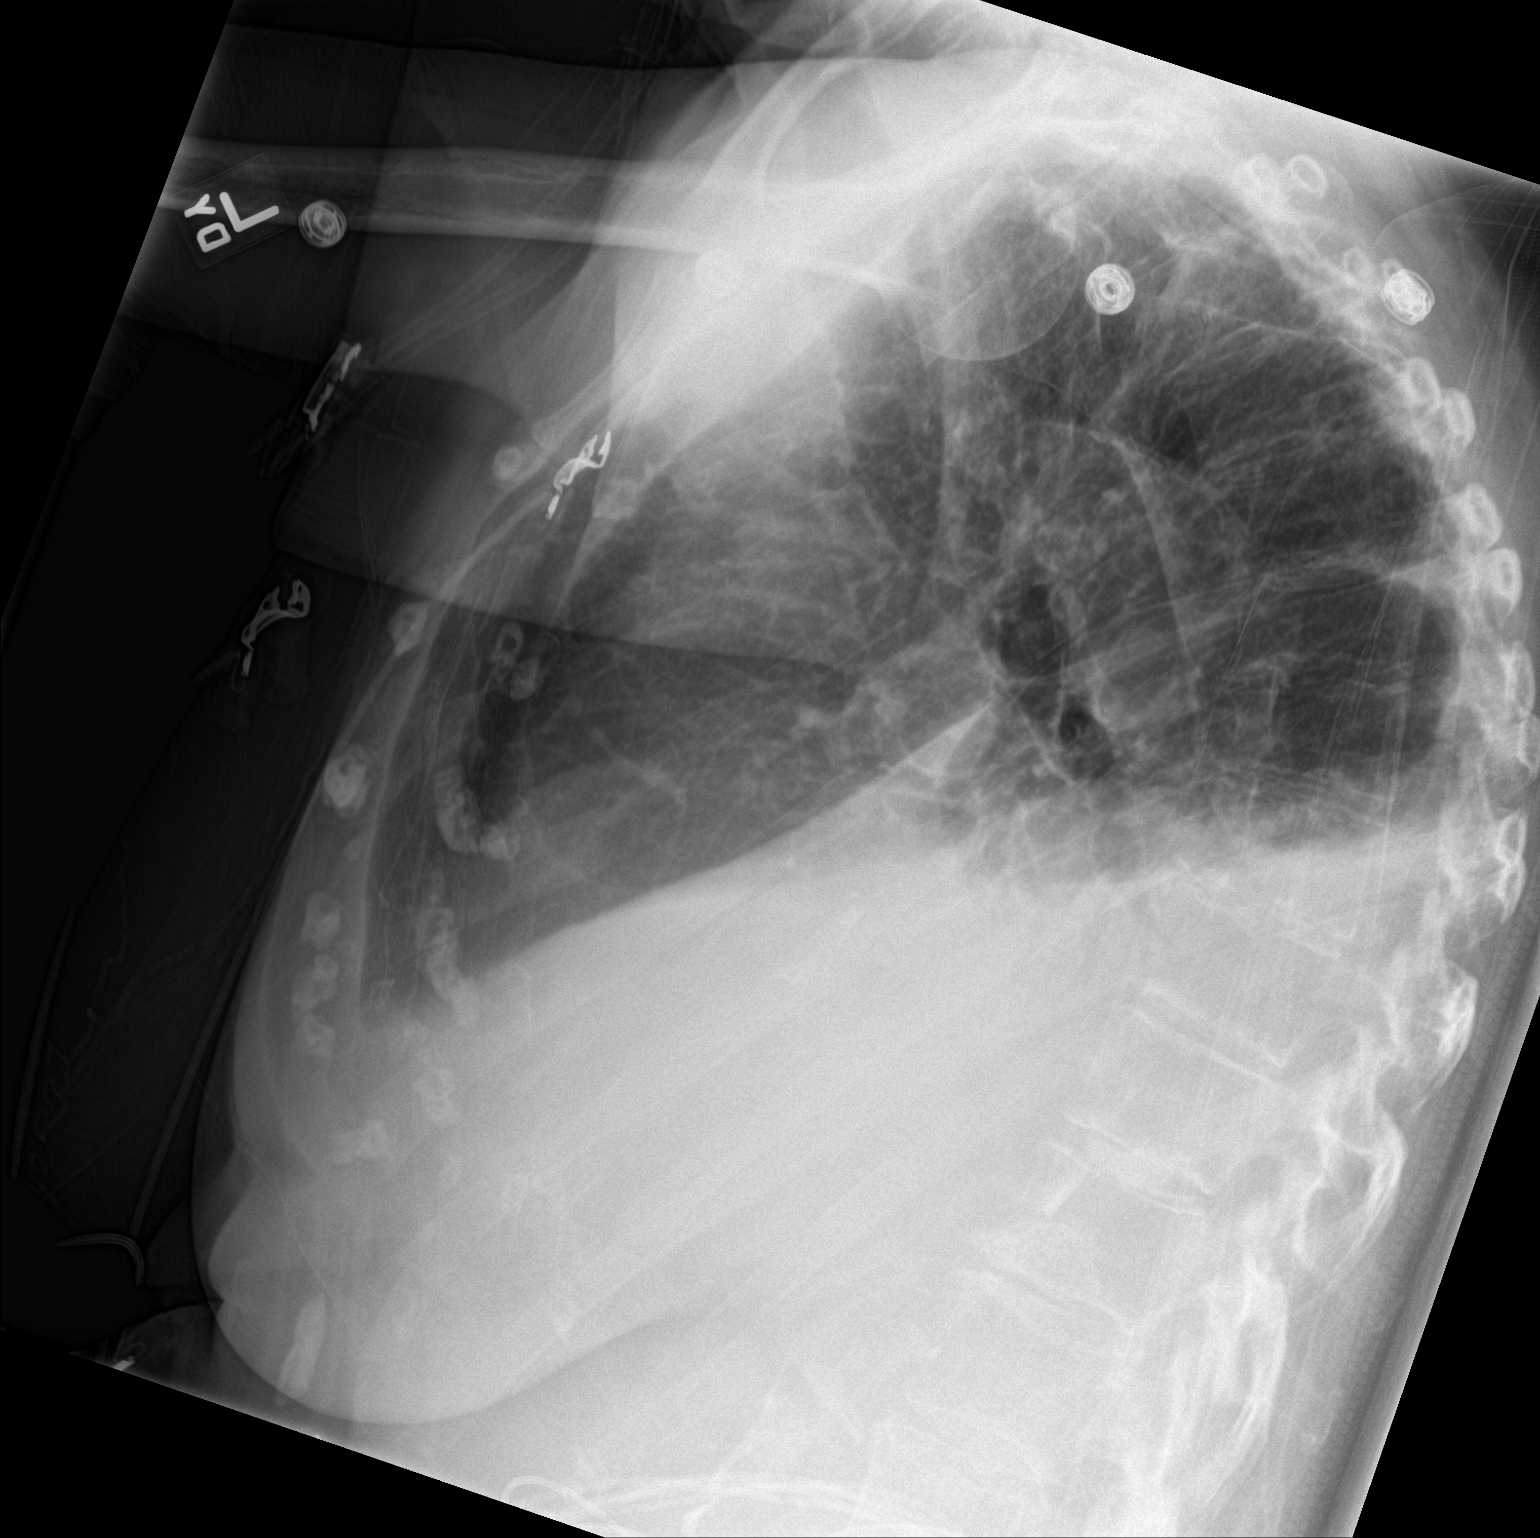
[im 2/2]
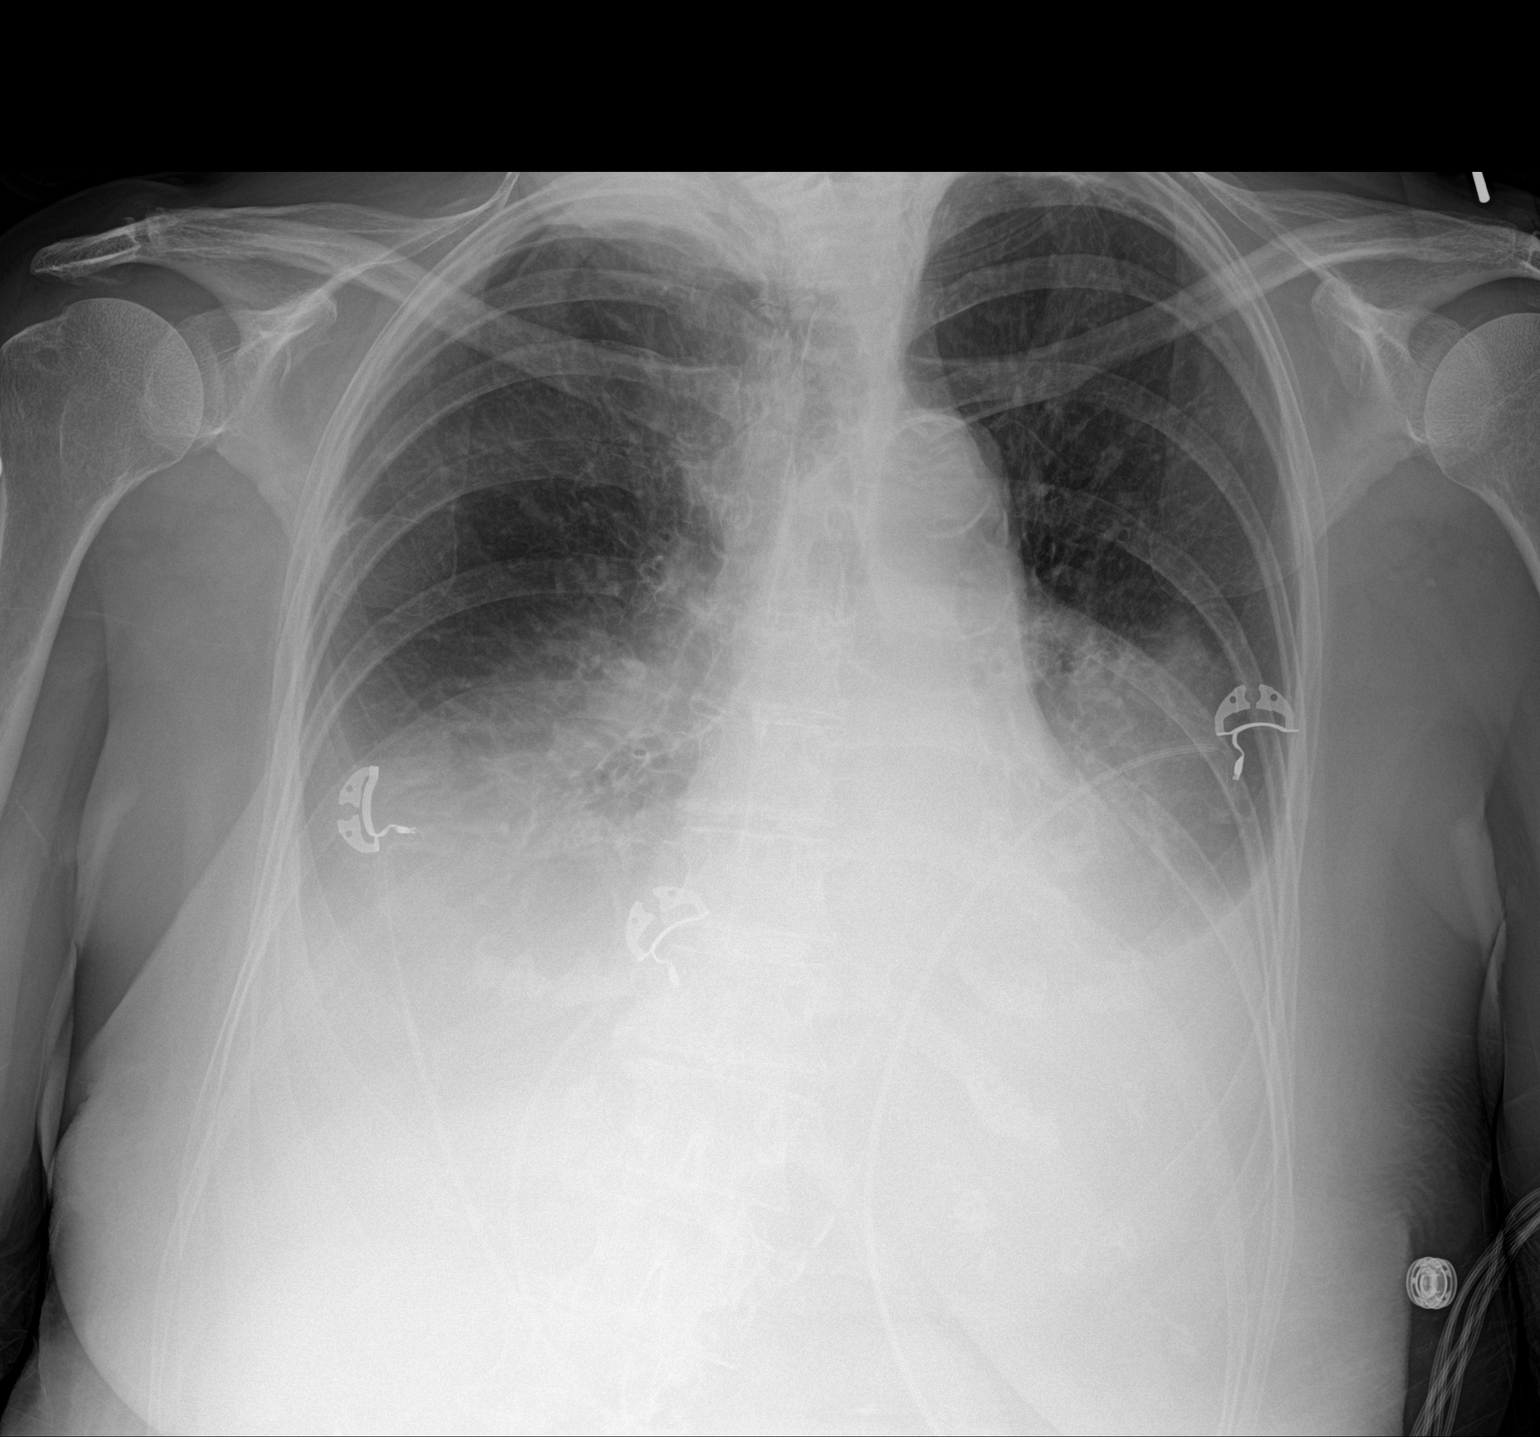

[2 of 2 positions shown; findings below may reference images not displayed]

FINDINGS: Moderate pleural effusion bilaterally unchanged. Compressive
atelectasis both lung bases unchanged. Negative for edema or
pneumonia. Apical pleural scarring bilaterally is stable.
IMPRESSION: Moderate bilateral effusions and bibasilar atelectasis unchanged.
